# Patient Record
Sex: Male | Born: 1995 | Race: White | Hispanic: No | Marital: Single | Smoking: Never smoker
Health system: Southern US, Community
[De-identification: ages and names within clinical notes are randomized; demographics above are authoritative.]

---
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
## 2017-06-07 ENCOUNTER — Encounter: Payer: Self-pay | Admitting: Family Medicine

## 2017-06-07 ENCOUNTER — Ambulatory Visit (INDEPENDENT_AMBULATORY_CARE_PROVIDER_SITE_OTHER): Payer: BC Managed Care – PPO | Admitting: Family Medicine

## 2017-06-07 VITALS — BP 117/55 | HR 53 | Temp 97.9°F | Resp 14

## 2017-06-07 DIAGNOSIS — L02414 Cutaneous abscess of left upper limb: Secondary | ICD-10-CM

## 2017-06-07 NOTE — Progress Notes (Signed)
Patient presents today for follow-up regarding abscess on left forearm. Patient states that he went to an urgent care on Saturday and had the area lanced. He states that the area is much better now. He has minimal pain and the area has gotten much smaller. He denies any history of MRSA in the past. He states that a culture was taken of the area. He is unsure of the antibiotic that he is on. He denies any other areas on his body that are similar to this.  ROS: Negative except mentioned above. Vitals as per Epic. GENERAL: NAD RESP: CTA B CARD: RRR SKIN: Small 4mm open wound noted on the dorsal surface of the left forearm, there is a dime-sized amount of induration, no significant drainage from the site, no streaks, minimal tenderness to palpation NEURO: CN II-XII grossly intact   A/P: Abscess left forearm - improved after being lanced, continue current antibiotics, patient to call urgent care tomorrow to obtain culture results, my fax number was given to him to give to the urgent care, keep the area clean and dry, keep the area covered when in the weight room and wear a long sleeve shirt, have the trainer looked at the area daily for interval change, seek medical attention if symptoms persist/worsen. Patient addresses understanding and plan.

## 2017-12-11 ENCOUNTER — Other Ambulatory Visit: Payer: Self-pay | Admitting: Family Medicine

## 2017-12-11 ENCOUNTER — Ambulatory Visit
Admission: RE | Admit: 2017-12-11 | Discharge: 2017-12-11 | Disposition: A | Discharge status: Home / Self Care | Payer: 59 | Source: Ambulatory Visit | Attending: Family Medicine | Admitting: Family Medicine

## 2017-12-11 DIAGNOSIS — M25472 Effusion, left ankle: Secondary | ICD-10-CM | POA: Insufficient documentation

## 2017-12-11 DIAGNOSIS — M25572 Pain in left ankle and joints of left foot: Secondary | ICD-10-CM | POA: Insufficient documentation

## 2017-12-11 DIAGNOSIS — M7989 Other specified soft tissue disorders: Secondary | ICD-10-CM | POA: Insufficient documentation

## 2018-10-06 ENCOUNTER — Other Ambulatory Visit: Payer: Self-pay | Admitting: Hematology

## 2018-10-06 DIAGNOSIS — Z20822 Contact with and (suspected) exposure to covid-19: Secondary | ICD-10-CM

## 2018-10-06 NOTE — Progress Notes (Signed)
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 Posey Pronto. /

## 2018-10-07 ENCOUNTER — Other Ambulatory Visit: Payer: PRIVATE HEALTH INSURANCE

## 2018-10-07 DIAGNOSIS — Z20822 Contact with and (suspected) exposure to covid-19: Secondary | ICD-10-CM

## 2018-10-09 LAB — NOVEL CORONAVIRUS, NAA: SARS-CoV-2, NAA: NOT DETECTED

## 2018-10-27 ENCOUNTER — Other Ambulatory Visit: Payer: Self-pay | Admitting: *Deleted

## 2018-10-27 DIAGNOSIS — Z20822 Contact with and (suspected) exposure to covid-19: Secondary | ICD-10-CM

## 2018-10-31 ENCOUNTER — Other Ambulatory Visit: Payer: PRIVATE HEALTH INSURANCE

## 2018-10-31 DIAGNOSIS — Z20822 Contact with and (suspected) exposure to covid-19: Secondary | ICD-10-CM

## 2018-11-05 LAB — NOVEL CORONAVIRUS, NAA: SARS-CoV-2, NAA: NOT DETECTED

## 2018-12-05 ENCOUNTER — Other Ambulatory Visit: Payer: Self-pay

## 2018-12-05 DIAGNOSIS — Z20822 Contact with and (suspected) exposure to covid-19: Secondary | ICD-10-CM

## 2018-12-06 LAB — NOVEL CORONAVIRUS, NAA: SARS-CoV-2, NAA: NOT DETECTED

## 2019-01-10 ENCOUNTER — Other Ambulatory Visit: Payer: Self-pay

## 2019-01-10 ENCOUNTER — Telehealth (INDEPENDENT_AMBULATORY_CARE_PROVIDER_SITE_OTHER): Payer: PRIVATE HEALTH INSURANCE | Admitting: Family Medicine

## 2019-01-10 DIAGNOSIS — R509 Fever, unspecified: Secondary | ICD-10-CM | POA: Diagnosis not present

## 2019-01-10 DIAGNOSIS — R05 Cough: Secondary | ICD-10-CM | POA: Diagnosis not present

## 2019-01-10 DIAGNOSIS — R059 Cough, unspecified: Secondary | ICD-10-CM

## 2019-01-10 DIAGNOSIS — R51 Headache: Secondary | ICD-10-CM

## 2019-01-10 DIAGNOSIS — Z20828 Contact with and (suspected) exposure to other viral communicable diseases: Secondary | ICD-10-CM | POA: Diagnosis not present

## 2019-01-10 DIAGNOSIS — Z20822 Contact with and (suspected) exposure to covid-19: Secondary | ICD-10-CM

## 2019-01-10 DIAGNOSIS — R5383 Other fatigue: Secondary | ICD-10-CM

## 2019-01-11 ENCOUNTER — Other Ambulatory Visit: Payer: Self-pay

## 2019-01-11 NOTE — Progress Notes (Signed)
Virtual Visit Agreed To By Patient Patient admits to having fever, chills, cough, fatigue, headache since yesterday. Denies sore throat, CP, SOB, N/V/D, abdominal pain, severe headache. Has been taking Tylenol. Roommates have similar symptoms as well.   ROS: Negative except mentioned above. GENERAL: NAD *No Exam  A/P: Viral Symptoms -will test for COVID-19, Tylenol/Ibuprofen prn, rest, hydration, if worsening symptoms go to the ER as discussed, quarantine in room, student concerns will be emailed and will contact patient, no athletic activity, patient appreciative, no further questions.

## 2019-01-12 LAB — NOVEL CORONAVIRUS, NAA: SARS-CoV-2, NAA: DETECTED — AB

## 2019-01-23 ENCOUNTER — Other Ambulatory Visit: Payer: Self-pay | Admitting: Family Medicine

## 2019-01-23 ENCOUNTER — Other Ambulatory Visit: Payer: Self-pay

## 2019-01-23 ENCOUNTER — Ambulatory Visit (INDEPENDENT_AMBULATORY_CARE_PROVIDER_SITE_OTHER): Payer: PRIVATE HEALTH INSURANCE | Admitting: Family Medicine

## 2019-01-23 DIAGNOSIS — Z0000 Encounter for general adult medical examination without abnormal findings: Secondary | ICD-10-CM | POA: Diagnosis not present

## 2019-01-23 DIAGNOSIS — U071 COVID-19: Secondary | ICD-10-CM

## 2019-01-23 DIAGNOSIS — I400 Infective myocarditis: Secondary | ICD-10-CM

## 2019-01-23 DIAGNOSIS — Z8619 Personal history of other infectious and parasitic diseases: Secondary | ICD-10-CM

## 2019-01-23 DIAGNOSIS — Z8616 Personal history of COVID-19: Secondary | ICD-10-CM

## 2019-01-24 LAB — TROPONIN I: Troponin I: <0.01 ng/mL (ref 0.00–0.04)

## 2019-01-28 ENCOUNTER — Ambulatory Visit (INDEPENDENT_AMBULATORY_CARE_PROVIDER_SITE_OTHER): Payer: PRIVATE HEALTH INSURANCE

## 2019-01-28 ENCOUNTER — Other Ambulatory Visit: Payer: PRIVATE HEALTH INSURANCE

## 2019-01-28 ENCOUNTER — Other Ambulatory Visit: Payer: Self-pay

## 2019-01-28 DIAGNOSIS — U071 COVID-19: Secondary | ICD-10-CM | POA: Diagnosis not present

## 2019-01-28 DIAGNOSIS — Z8619 Personal history of other infectious and parasitic diseases: Secondary | ICD-10-CM | POA: Diagnosis not present

## 2019-01-28 DIAGNOSIS — Z8616 Personal history of COVID-19: Secondary | ICD-10-CM

## 2019-01-28 DIAGNOSIS — I400 Infective myocarditis: Secondary | ICD-10-CM

## 2019-01-28 NOTE — Progress Notes (Signed)
2D Echocardiogram performed   01/28/19

## 2019-02-05 ENCOUNTER — Telehealth: Payer: Self-pay | Admitting: Family Medicine

## 2019-02-05 NOTE — Telephone Encounter (Signed)
Called and explained to patient that Cardiology has reviewed the ECG and Echocardiogram and has not informed me that any further cardiac workup is needed for patient to be able to resume physical activity. Patient is to seek medical attention if any cardiopulmonary symptoms develop once activity is resumed. Patient addresses understanding and has no further questions or concerns.  

## 2019-02-09 ENCOUNTER — Other Ambulatory Visit: Payer: PRIVATE HEALTH INSURANCE

## 2019-05-28 IMAGING — CR DG ANKLE COMPLETE 3+V*L*
1 series · 3 of 3 positions shown · non-contrast
Comparison: None.

CLINICAL DATA: Left ankle pain after a twisting injury last night
playing football. Swelling and tenderness at the lateral aspect of
the left ankle.

EXAM:
LEFT ANKLE COMPLETE - 3+ VIEW

[Series 1: dg ankle complete left · 0.14mm/px · 3 of 3 slices shown]
[im 1/3]
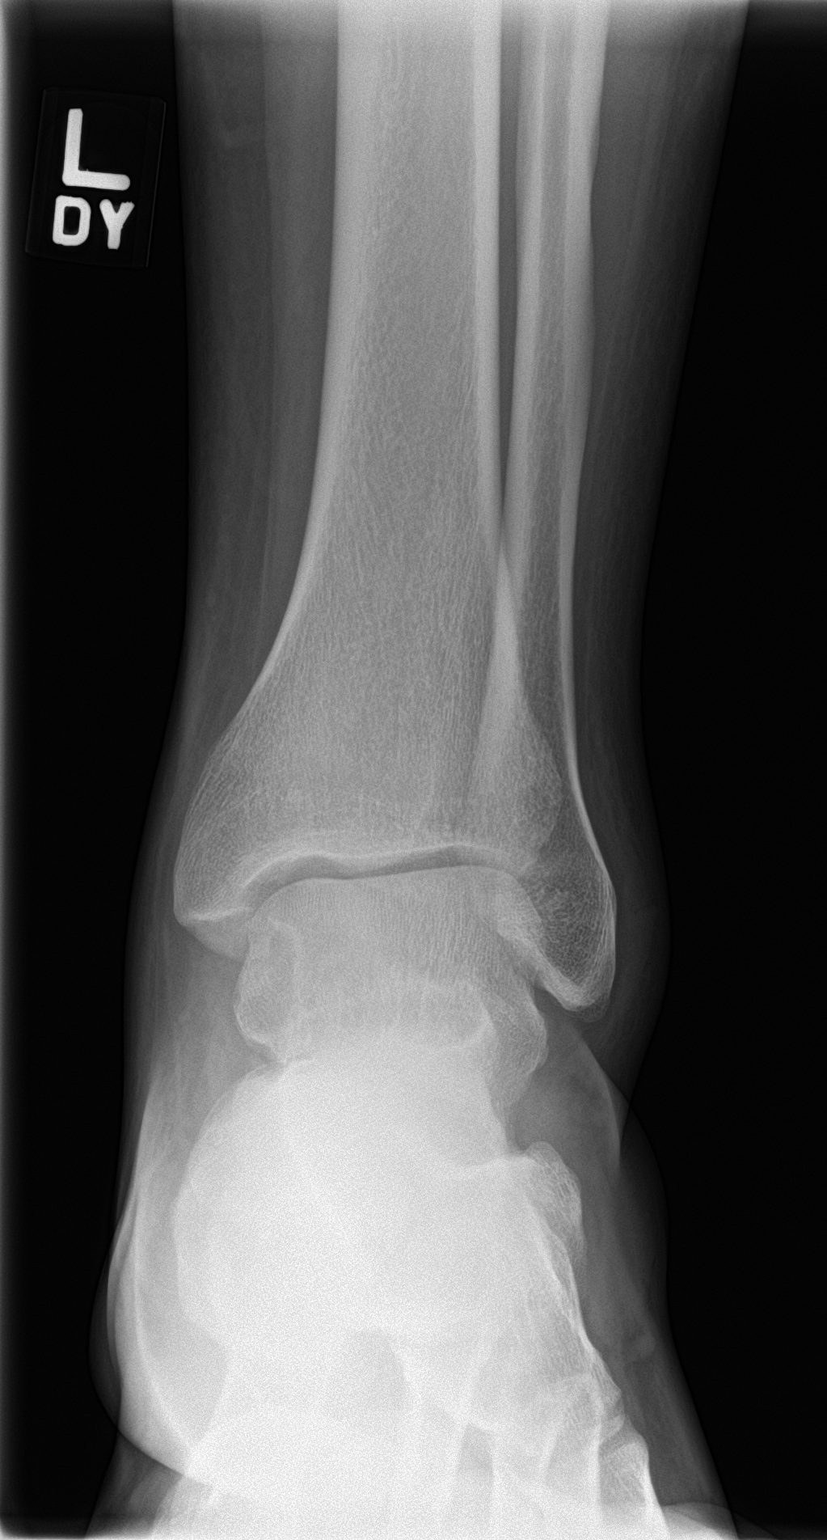
[im 2/3]
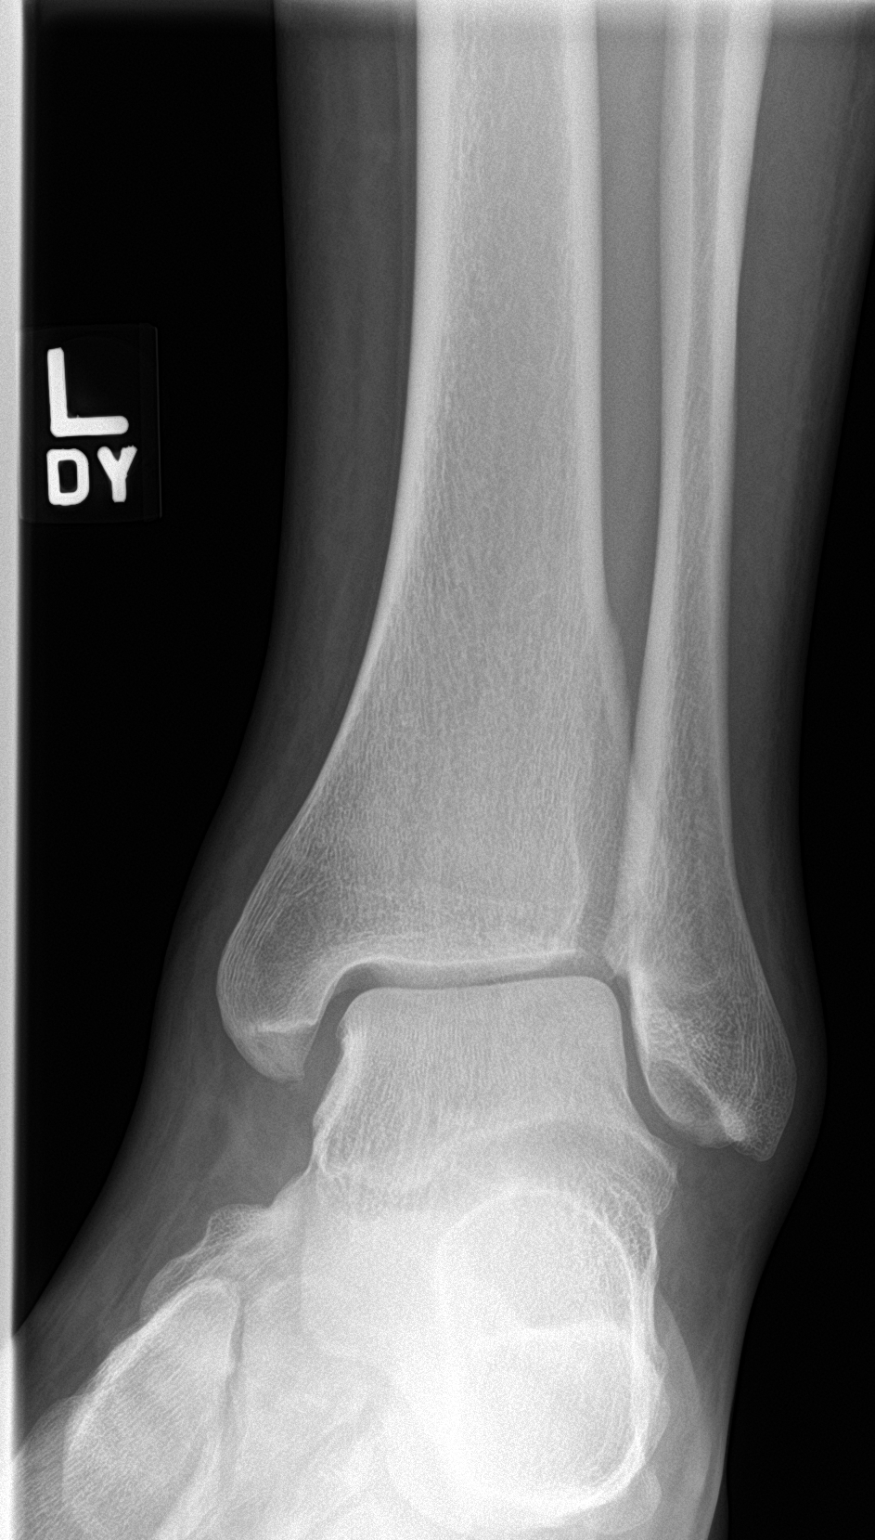
[im 3/3]
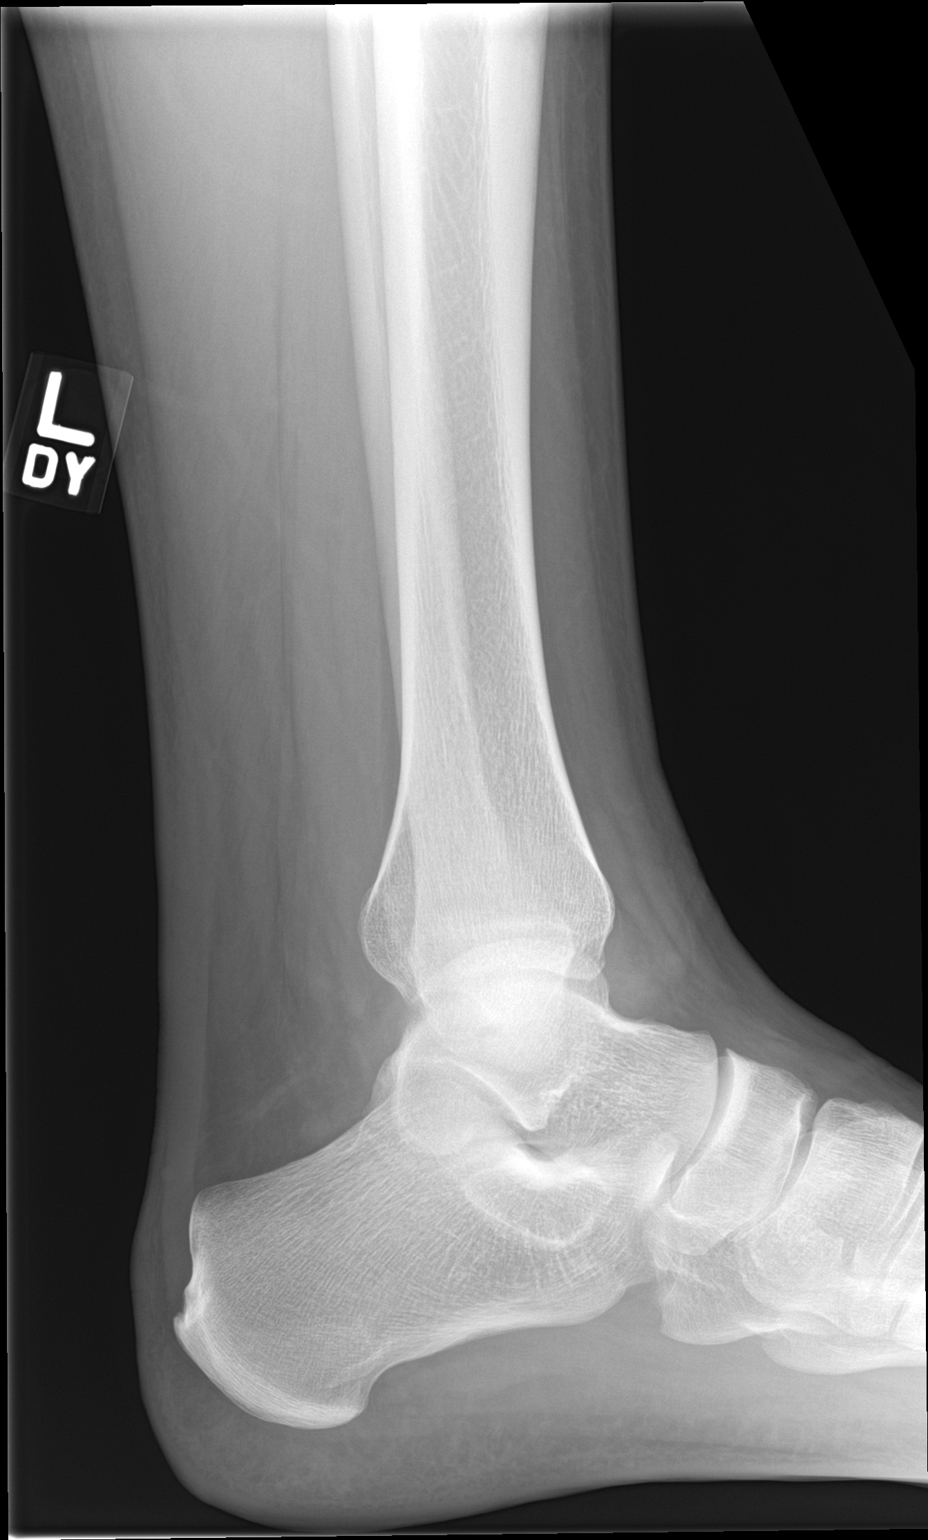

[3 of 3 positions shown; findings below may reference images not displayed]

FINDINGS: There is no fracture or dislocation. There is an ankle joint
effusion. Circumferential ankle soft tissue swelling most prominent
over the lateral malleolus.
IMPRESSION: Soft tissue swelling and ankle effusion.  No bone abnormality.

## 2019-08-21 ENCOUNTER — Ambulatory Visit
Admission: RE | Admit: 2019-08-21 | Discharge: 2019-08-21 | Disposition: A | Discharge status: Home / Self Care | Payer: 59 | Source: Ambulatory Visit | Attending: Sports Medicine | Admitting: Sports Medicine

## 2019-08-21 ENCOUNTER — Other Ambulatory Visit: Payer: Self-pay

## 2019-08-21 ENCOUNTER — Other Ambulatory Visit: Payer: Self-pay | Admitting: Sports Medicine

## 2019-08-21 DIAGNOSIS — R52 Pain, unspecified: Secondary | ICD-10-CM

## 2021-02-04 IMAGING — CR DG HIP (WITH OR WITHOUT PELVIS) 2-3V*R*
1 series · 3 of 3 positions shown · non-contrast
Comparison: None

CLINICAL DATA: Pain. Lack of range of motion.

EXAM:
DG HIP (WITH OR WITHOUT PELVIS) 2-3V RIGHT

[Series 1: dg hip unilat w or w/o pelvis 2-3 views  · non-contrast · 0.14mm/px · 3 of 3 slices shown]
[im 1/3]
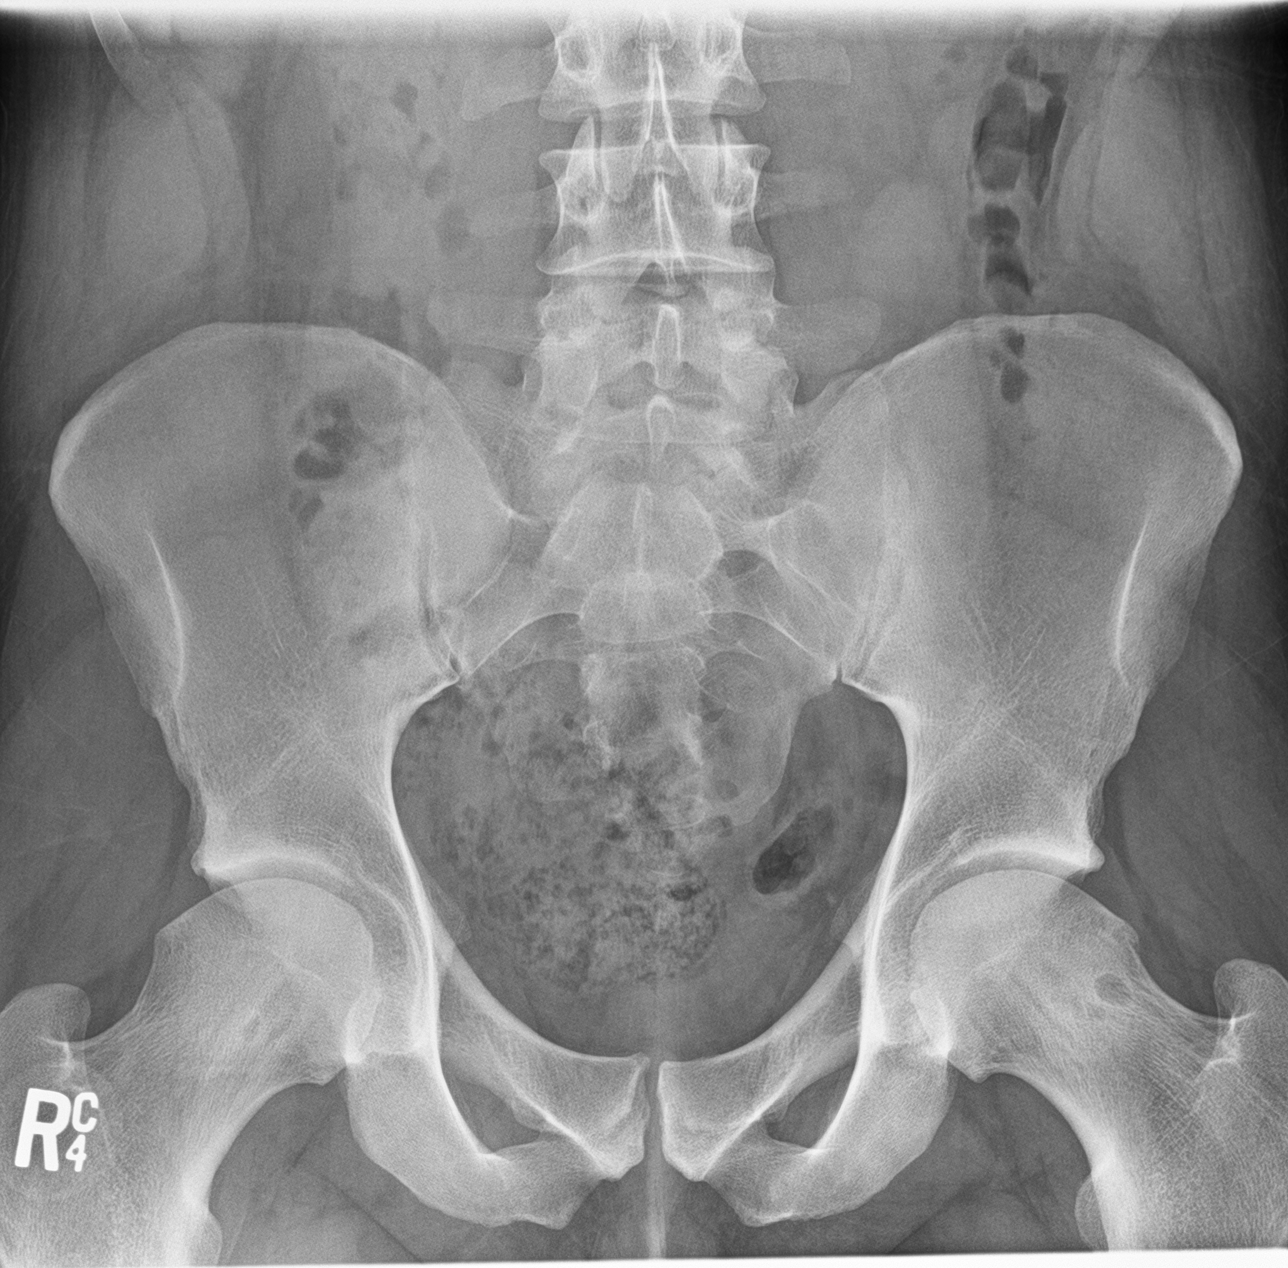
[im 2/3]
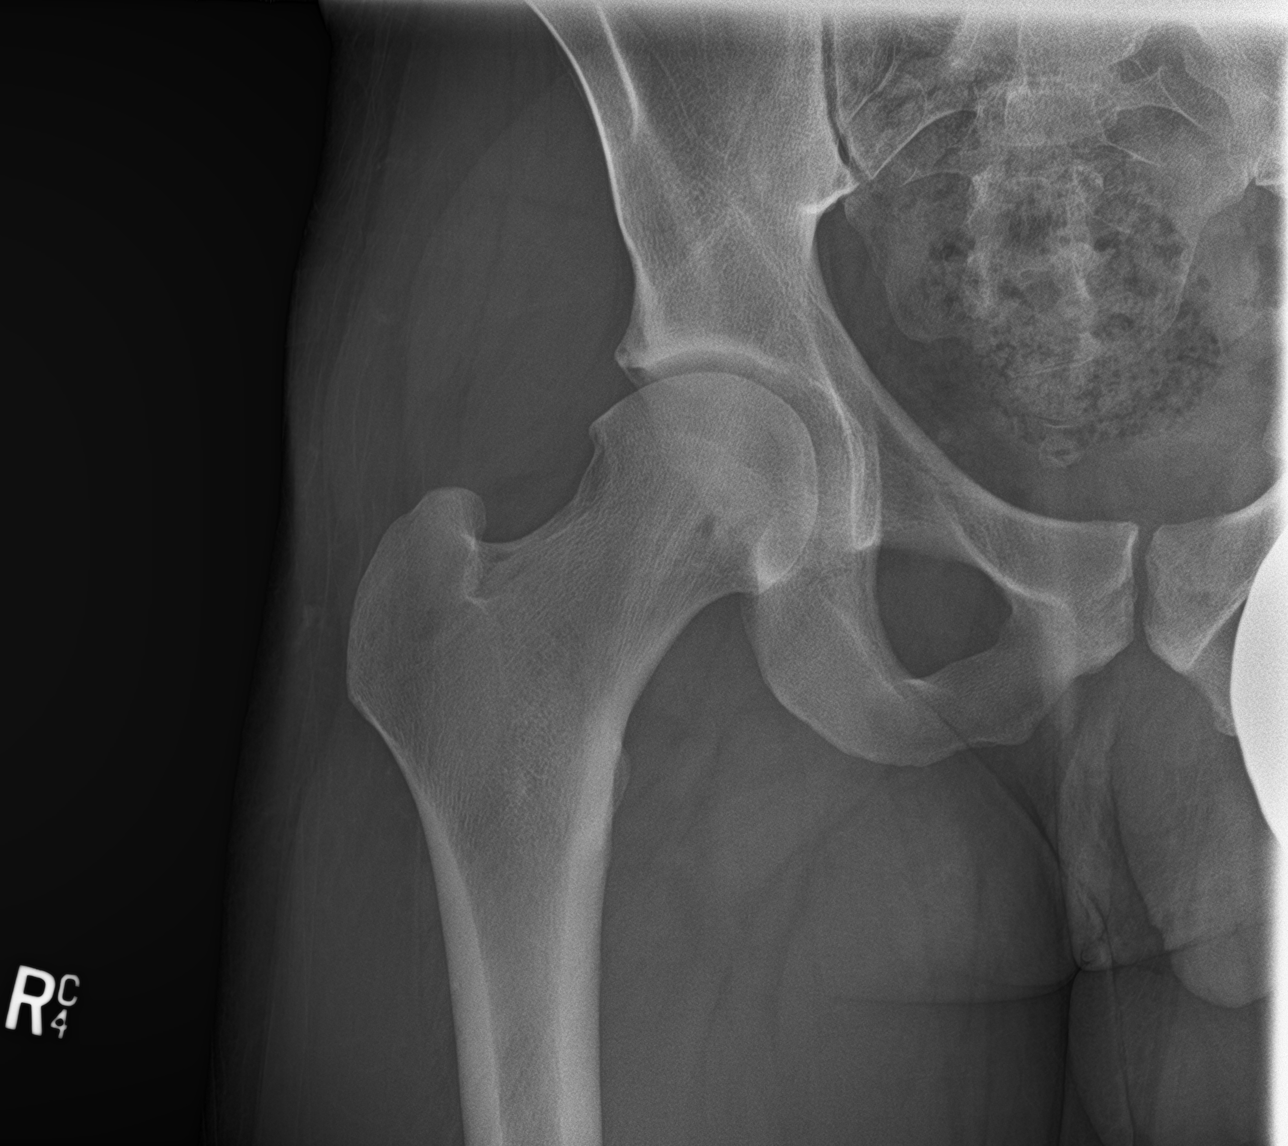
[im 3/3]
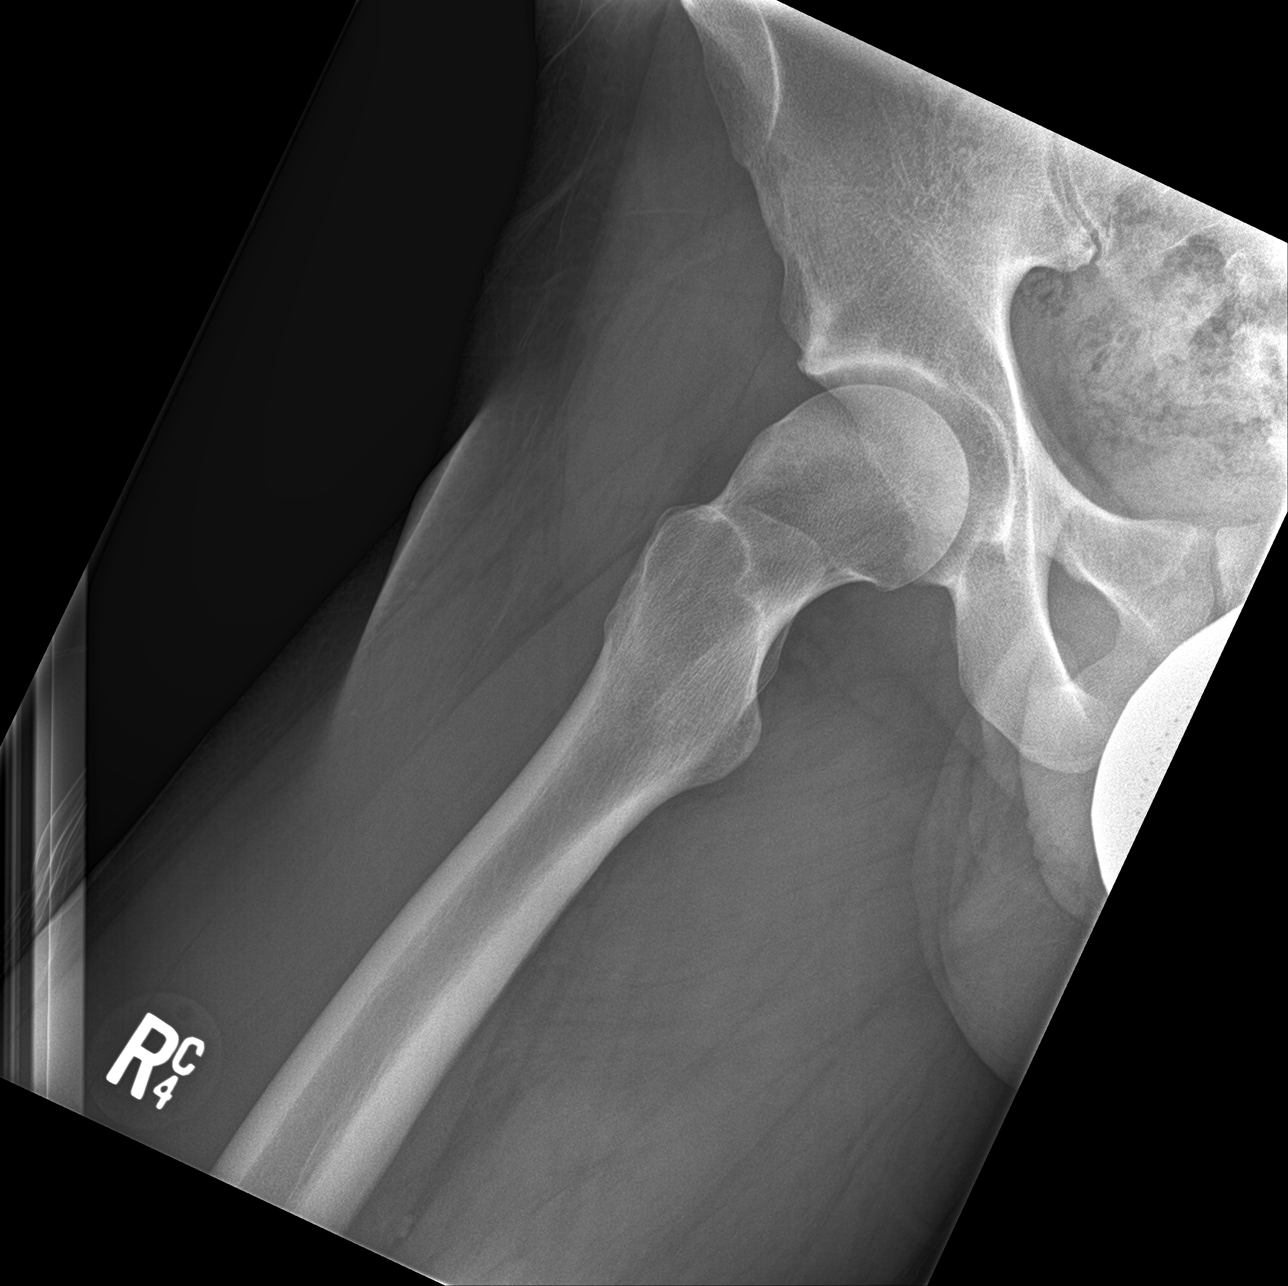

[3 of 3 positions shown; findings below may reference images not displayed]

FINDINGS: There are small synovial herniation pits in femoral necks
bilaterally. These are benign. The patient has cam type
configurations of the left femoral heads and necks bilaterally, more
prominent on the left than the right which could predispose to
femoroacetabular impingement. The bones of the pelvis otherwise
appear normal.
IMPRESSION: No acute abnormality. Cam type configurations of the left femoral
heads and necks could predispose to femoroacetabular impingement.

Benign synovial herniation pits in both femoral necks which require
no further evaluation.

## 2021-12-05 ENCOUNTER — Emergency Department: Payer: 59

## 2021-12-05 ENCOUNTER — Emergency Department
Admission: EM | Admit: 2021-12-05 | Discharge: 2021-12-05 | Disposition: A | Discharge status: Home / Self Care | Payer: 59 | Attending: Emergency Medicine | Admitting: Emergency Medicine

## 2021-12-05 ENCOUNTER — Encounter: Payer: Self-pay | Admitting: Intensive Care

## 2021-12-05 DIAGNOSIS — R7989 Other specified abnormal findings of blood chemistry: Secondary | ICD-10-CM | POA: Insufficient documentation

## 2021-12-05 DIAGNOSIS — N50811 Right testicular pain: Secondary | ICD-10-CM | POA: Diagnosis present

## 2021-12-05 DIAGNOSIS — R944 Abnormal results of kidney function studies: Secondary | ICD-10-CM | POA: Diagnosis not present

## 2021-12-05 LAB — CBC WITH DIFFERENTIAL/PLATELET
Abs Immature Granulocytes: 0.01 10*3/uL (ref 0.00–0.07)
Basophils Absolute: 0 10*3/uL (ref 0.0–0.1)
Basophils Relative: 1 %
Eosinophils Absolute: 0.1 10*3/uL (ref 0.0–0.5)
Eosinophils Relative: 1 %
HCT: 41.4 % (ref 39.0–52.0)
Hemoglobin: 13.3 g/dL (ref 13.0–17.0)
Immature Granulocytes: 0 %
Lymphocytes Relative: 29 %
Lymphs Abs: 1.7 10*3/uL (ref 0.7–4.0)
MCH: 30.2 pg (ref 26.0–34.0)
MCHC: 32.1 g/dL (ref 30.0–36.0)
MCV: 93.9 fL (ref 80.0–100.0)
Monocytes Absolute: 0.4 10*3/uL (ref 0.1–1.0)
Monocytes Relative: 6 %
Neutro Abs: 3.8 10*3/uL (ref 1.7–7.7)
Neutrophils Relative %: 63 %
Platelets: 202 10*3/uL (ref 150–400)
RBC: 4.41 MIL/uL (ref 4.22–5.81)
RDW: 13.5 % (ref 11.5–15.5)
WBC: 6 10*3/uL (ref 4.0–10.5)
nRBC: 0 % (ref 0.0–0.2)

## 2021-12-05 LAB — CHLAMYDIA/NGC RT PCR (ARMC ONLY)
Chlamydia Tr: NOT DETECTED
N gonorrhoeae: NOT DETECTED

## 2021-12-05 LAB — URINALYSIS, ROUTINE W REFLEX MICROSCOPIC
Bilirubin Urine: NEGATIVE
Glucose, UA: NEGATIVE mg/dL
Hgb urine dipstick: NEGATIVE
Ketones, ur: NEGATIVE mg/dL
Leukocytes,Ua: NEGATIVE
Nitrite: NEGATIVE
Protein, ur: NEGATIVE mg/dL
Specific Gravity, Urine: 1.008 (ref 1.005–1.030)
pH: 5 (ref 5.0–8.0)

## 2021-12-05 LAB — COMPREHENSIVE METABOLIC PANEL
ALT: 64 U/L — ABNORMAL HIGH (ref 0–44)
AST: 48 U/L — ABNORMAL HIGH (ref 15–41)
Albumin: 5.1 g/dL — ABNORMAL HIGH (ref 3.5–5.0)
Alkaline Phosphatase: 68 U/L (ref 38–126)
Anion gap: 7 (ref 5–15)
BUN: 28 mg/dL — ABNORMAL HIGH (ref 6–20)
CO2: 26 mmol/L (ref 22–32)
Calcium: 9.3 mg/dL (ref 8.9–10.3)
Chloride: 105 mmol/L (ref 98–111)
Creatinine, Ser: 1.44 mg/dL — ABNORMAL HIGH (ref 0.61–1.24)
GFR, Estimated: >60 mL/min (ref >60)
Glucose, Bld: 105 mg/dL — ABNORMAL HIGH (ref 70–99)
Potassium: 3.9 mmol/L (ref 3.5–5.1)
Sodium: 138 mmol/L (ref 135–145)
Total Bilirubin: 1 mg/dL (ref 0.3–1.2)
Total Protein: 7.7 g/dL (ref 6.5–8.1)

## 2021-12-05 LAB — LIPASE, BLOOD: Lipase: 37 U/L (ref 11–51)

## 2021-12-05 LAB — CK: Total CK: 505 U/L — ABNORMAL HIGH (ref 49–397)

## 2021-12-05 MED ORDER — IOHEXOL 300 MG/ML  SOLN
100.0000 mL | Freq: Once | INTRAMUSCULAR | Status: AC | PRN
  Administered 2021-12-05: 100 mL via INTRAVENOUS

## 2021-12-05 MED ORDER — SODIUM CHLORIDE 0.9 % IV BOLUS
1000.0000 mL | Freq: Once | INTRAVENOUS | Status: AC
  Administered 2021-12-05: 1000 mL via INTRAVENOUS

## 2021-12-05 NOTE — ED Provider Notes (Signed)
Emergency department handoff note  Care of this patient was signed out to me at the end of the previous provider shift.  All pertinent patient information was conveyed and all questions were answered.  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  Patient's pain controlled and discussed the incidental finding of abdominal kidney that patient was not aware of.  The patient has been reexamined and is ready to be discharged.  All diagnostic results have been reviewed and discussed with the patient/family.  Care plan has been outlined and the patient/family understands all current diagnoses, results, and treatment plans.  There are no new complaints, changes, or physical findings at this time.  All questions have been addressed and answered.  Patient was instructed to, and agrees to follow-up with their primary care physician, urologist, as well as return to the emergency department if any new or worsening symptoms develop.   Merwyn Katos, MD 12/05/21 [phone]

## 2021-12-05 NOTE — ED Triage Notes (Signed)
Pt c/o right testicle pain that started Wednesday afternoon. Reports testicle drawing up. Pt reports no difficulty urinating.

## 2021-12-05 NOTE — ED Provider Notes (Addendum)
[facility] Provider Note    Event Date/Time   First MD Initiated Contact with Patient 12/05/21 1607     (approximate)   History   Testicle Pain   HPI  Philip Nelson is a 26 y.o. male who is otherwise healthy who comes in with concern for right testicle pain.  Patient does report being sexually active with women but no anal intercourse.  He denies any discharge from his penis.  He does report having a history of being with somebody who had chlamydia back in college and being treated for it but he never was actually tested for it.  He does report that the pain has been going to coming and going over the past 2 days currently a 3 out of 10.  Has not taken anything to help with the pain.  He does work out.  The pain occasionally does radiate up into the abdomen as well.  Physical Exam   Triage Vital Signs: ED Triage Vitals  Enc Vitals Group     BP 12/05/21 1540 124/67     Pulse Rate 12/05/21 1540 (!) 53     Resp 12/05/21 1540 18     Temp 12/05/21 1540 98.9 F (37.2 C)     Temp Source 12/05/21 1540 Oral     SpO2 12/05/21 1540 100 %     Weight 12/05/21 1542 235 lb (106.6 kg)     Height 12/05/21 1542 6\' 8"  (2.032 m)     Head Circumference --      Peak Flow --      Pain Score 12/05/21 1541 3     Pain Loc --      Pain Edu? --      Excl. in GC? --     Most recent vital signs: Vitals:   12/05/21 1540  BP: 124/67  Pulse: (!) 53  Resp: 18  Temp: 98.9 F (37.2 C)  SpO2: 100%     General: Awake, no distress.  CV:  Good peripheral perfusion.  Resp:  Normal effort.  Abd:  No distention.  Other:  Testicle exam overall was normal.  He reports some mild tenderness on the right testicle but there is no erythema noted.  No discharge from the penis.  He is circumcised.  He has no discoloration in the perineum area   ED Results / Procedures / Treatments   Labs (all labs ordered are listed, but only abnormal results are displayed) Labs Reviewed   URINALYSIS, ROUTINE W REFLEX MICROSCOPIC - Abnormal; Notable for the following components:      Result Value   Color, Urine STRAW (*)    APPearance CLEAR (*)    All other components within normal limits  COMPREHENSIVE METABOLIC PANEL - Abnormal; Notable for the following components:   Glucose, Bld 105 (*)    BUN 28 (*)    Creatinine, Ser 1.44 (*)    Albumin 5.1 (*)    AST 48 (*)    ALT 64 (*)    All other components within normal limits  CHLAMYDIA/NGC RT PCR ([facility] ONLY)            CBC WITH DIFFERENTIAL/PLATELET  LIPASE, BLOOD      RADIOLOGY CT pending   PROCEDURES:  Critical Care performed: No  Procedures   MEDICATIONS ORDERED IN ED: Medications - No data to display   IMPRESSION / MDM / ASSESSMENT AND PLAN / ED COURSE  I reviewed the triage vital signs and the nursing notes.  Patient's presentation is most consistent with acute presentation with potential threat to life or bodily function.   Patient comes in with testicle pain.  Ultrasound ordered to evaluate for any evidence of torsion, epididymitis, orchitis although his exam seems pretty reassuring.  He does have some right lower quadrant pain so if negative will consider CT imaging.  Patient declining anything for pain at this time  Labs show normal lipase normal CBC CMP does show elevated liver test as well as elevated creatinine.  We will give some IV fluids and add on CK. He was not in room when I signed out to ask about alcohol/tylenol use but did ask next doctor to follow up on his LFTS  Patient handed off to oncoming team pending CT imaging and further evaluation  The patient is on the cardiac monitor to evaluate for evidence of arrhythmia and/or significant heart rate changes.      FINAL CLINICAL IMPRESSION(S) / ED DIAGNOSES   Final diagnoses:  Pain in right testicle  Liver function test abnormality     Rx / DC Orders   ED Discharge Orders     None        Note:  This document was  prepared using Dragon voice recognition software and may include unintentional dictation errors.   Concha Se, MD 12/05/21 1810    Concha Se, MD 12/05/21 [phone]

## 2021-12-05 NOTE — ED Notes (Signed)
In US at this time.

## 2021-12-05 NOTE — ED Provider Triage Note (Signed)
Emergency Medicine Provider Triage Evaluation Note  Philip Nelson , a 26 y.o. male  was evaluated in triage.  Pt complains of right testicle pain x 2 days. No urinary symptoms. No fever.  Physical Exam  BP 124/67 (BP Location: Left Arm)   Pulse (!) 53   Temp 98.9 F (37.2 C) (Oral)   Resp 18   Ht 6\' 8"  (2.032 m)   Wt 106.6 kg   SpO2 100%   BMI 25.82 kg/m  Gen:   Awake, no distress   Resp:  Normal effort  MSK:   Moves extremities without difficulty  Other:    Medical Decision Making  Medically screening exam initiated at 3:43 PM.  Appropriate orders placed.  Miachel Nardelli was informed that the remainder of the evaluation will be completed by another provider, this initial triage assessment does not replace that evaluation, and the importance of remaining in the ED until their evaluation is complete.    Jasmine December, FNP 12/05/21 1544

## 2021-12-05 NOTE — Discharge Instructions (Addendum)
You can take ibuprofen 400 every 8 hours with food. Try to use supportive underwear.  If you continue to have pain then please call urology to make a follow-up appointment.  Your liver tests are also slightly elevated.  You can follow this up with your primary care doctor.  Try to avoid alcohol, Tylenol use.

## 2021-12-16 ENCOUNTER — Ambulatory Visit: Payer: Commercial Managed Care - PPO | Admitting: Urology

## 2021-12-25 ENCOUNTER — Encounter: Payer: Self-pay | Admitting: Urology

## 2021-12-25 ENCOUNTER — Ambulatory Visit: Payer: 59 | Admitting: Urology

## 2021-12-25 VITALS — BP 115/69 | HR 48 | Ht >= 80 in | Wt 230.0 lb

## 2021-12-25 DIAGNOSIS — R102 Pelvic and perineal pain: Secondary | ICD-10-CM | POA: Diagnosis not present

## 2021-12-25 DIAGNOSIS — N5082 Scrotal pain: Secondary | ICD-10-CM

## 2021-12-25 NOTE — Progress Notes (Signed)
   12/25/21 8:39 AM   Jasmine December October 24, 1995 [id number]  CC: Right scrotal and abdominal pain, nausea  HPI: Healthy 26 year old male who originally presented to urgent care on 8/10 with some right-sided scrotal and abdominal pain.  STD testing and urinalysis was benign and he was discharged.  He had some worsening symptoms the next day and presented to the ER where extensive work-up was performed.  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  No hydronephrosis or stones were seen.  His symptoms resolved in a few days, and he denies any complaints today.  He has any urinary problems or gross hematuria.  He also had some abnormalities on lab work including elevated LFTs and CK.    Social History:  reports that he has never smoked. He has quit using smokeless tobacco. He reports that he does not drink alcohol and does not use drugs.  Physical Exam: BP 115/69 (BP Location: Left Arm, Patient Position: Sitting, Cuff Size: Large)   Pulse (!) 48   Ht 6\' 8"  (2.032 m)   Wt 230 lb (104.3 kg)   BMI 25.27 kg/m    Constitutional:  Alert and oriented, No acute distress. Cardiovascular: No clubbing, cyanosis, or edema. Respiratory: Normal respiratory effort, no increased work of breathing. GI: Abdomen is soft, nontender, nondistended, no abdominal masses  Laboratory Data: Reviewed  Pertinent Imaging: I have personally viewed and interpreted the scrotal ultrasound and CT showing ectopic location of the right kidney but no hydronephrosis or stones, and very small right-sided varicocele on scrotal ultrasound.  There is also moderate constipation on CT on my review.  Assessment & Plan:   26 year old male with a few days a week of some right lower abdominal pain, right scrotal pain, nausea that resolved spontaneously.  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  Reassurance provided.  Finally I recommended seeing his PCP for a repeat CMP in the setting of his previously elevated LFTs of unclear etiology.  I also provided some pelvic floor stretching exercises and we reviewed behavioral strategies if he has recurrence of his scrotal pain.  Follow-up with urology as needed   25, MD 12/25/2021  [facility] [address] [place], [place] [phone]
